# Patient Record
Sex: Male | Born: 2000 | Race: White | Hispanic: No | Marital: Single | Smoking: Never smoker
Health system: Southern US, Community
[De-identification: ages and names within clinical notes are randomized; demographics above are authoritative.]

## PROBLEM LIST (undated history)

## (undated) DIAGNOSIS — L309 Dermatitis, unspecified: Secondary | ICD-10-CM

## (undated) DIAGNOSIS — T7840XA Allergy, unspecified, initial encounter: Secondary | ICD-10-CM

## (undated) DIAGNOSIS — F909 Attention-deficit hyperactivity disorder, unspecified type: Secondary | ICD-10-CM

## (undated) HISTORY — DX: Allergy, unspecified, initial encounter: T78.40XA

## (undated) HISTORY — DX: Dermatitis, unspecified: L30.9

---
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
## 2021-07-09 DIAGNOSIS — J358 Other chronic diseases of tonsils and adenoids: Secondary | ICD-10-CM | POA: Diagnosis not present

## 2021-07-09 DIAGNOSIS — R040 Epistaxis: Secondary | ICD-10-CM | POA: Diagnosis not present

## 2021-07-09 DIAGNOSIS — J3489 Other specified disorders of nose and nasal sinuses: Secondary | ICD-10-CM | POA: Diagnosis not present

## 2021-07-09 DIAGNOSIS — J343 Hypertrophy of nasal turbinates: Secondary | ICD-10-CM | POA: Diagnosis not present

## 2021-10-15 DIAGNOSIS — R251 Tremor, unspecified: Secondary | ICD-10-CM | POA: Diagnosis not present

## 2021-10-15 DIAGNOSIS — M79641 Pain in right hand: Secondary | ICD-10-CM | POA: Diagnosis not present

## 2021-10-15 DIAGNOSIS — M79642 Pain in left hand: Secondary | ICD-10-CM | POA: Diagnosis not present

## 2021-10-15 DIAGNOSIS — M24239 Disorder of ligament, unspecified wrist: Secondary | ICD-10-CM | POA: Diagnosis not present

## 2023-01-28 DIAGNOSIS — Z111 Encounter for screening for respiratory tuberculosis: Secondary | ICD-10-CM | POA: Diagnosis not present

## 2024-08-31 DIAGNOSIS — F331 Major depressive disorder, recurrent, moderate: Secondary | ICD-10-CM | POA: Diagnosis not present

## 2024-08-31 DIAGNOSIS — F909 Attention-deficit hyperactivity disorder, unspecified type: Secondary | ICD-10-CM | POA: Diagnosis not present

## 2024-08-31 DIAGNOSIS — F410 Panic disorder [episodic paroxysmal anxiety] without agoraphobia: Secondary | ICD-10-CM | POA: Diagnosis not present

## 2024-08-31 DIAGNOSIS — F411 Generalized anxiety disorder: Secondary | ICD-10-CM | POA: Diagnosis not present

## 2024-09-29 DIAGNOSIS — F909 Attention-deficit hyperactivity disorder, unspecified type: Secondary | ICD-10-CM | POA: Diagnosis not present

## 2024-09-29 DIAGNOSIS — Z23 Encounter for immunization: Secondary | ICD-10-CM | POA: Diagnosis not present

## 2024-09-29 DIAGNOSIS — F411 Generalized anxiety disorder: Secondary | ICD-10-CM | POA: Diagnosis not present

## 2024-09-29 DIAGNOSIS — F331 Major depressive disorder, recurrent, moderate: Secondary | ICD-10-CM | POA: Diagnosis not present

## 2024-09-29 DIAGNOSIS — F410 Panic disorder [episodic paroxysmal anxiety] without agoraphobia: Secondary | ICD-10-CM | POA: Diagnosis not present

## 2024-10-07 ENCOUNTER — Other Ambulatory Visit: Payer: Self-pay

## 2024-10-07 ENCOUNTER — Emergency Department (HOSPITAL_BASED_OUTPATIENT_CLINIC_OR_DEPARTMENT_OTHER)
Admission: EM | Admit: 2024-10-07 | Discharge: 2024-10-07 | Disposition: A | Discharge status: Home / Self Care | Attending: Emergency Medicine | Admitting: Emergency Medicine

## 2024-10-07 DIAGNOSIS — S6991XA Unspecified injury of right wrist, hand and finger(s), initial encounter: Secondary | ICD-10-CM | POA: Diagnosis not present

## 2024-10-07 DIAGNOSIS — S61210A Laceration without foreign body of right index finger without damage to nail, initial encounter: Secondary | ICD-10-CM | POA: Diagnosis not present

## 2024-10-07 DIAGNOSIS — W268XXA Contact with other sharp object(s), not elsewhere classified, initial encounter: Secondary | ICD-10-CM | POA: Diagnosis not present

## 2024-10-07 NOTE — ED Triage Notes (Signed)
 Pt POV with lac to R index finger after trying to catch falling razor, bleeding controlled PTA.

## 2024-10-07 NOTE — Discharge Instructions (Signed)
 As we discussed, this wound will heal nicely with dermabond. Follow up with your doctor as needed.

## 2024-10-07 NOTE — ED Provider Notes (Signed)
 [place] EMERGENCY DEPARTMENT AT [facility] Provider Note   CSN: [id number] Arrival date & time: 10/07/24  2107     Patient presents with: Laceration   Theodore Simmons is a 23 y.o. male.   Patient to ED with finger laceration after trying to catch a dropped razor. No other injury. Tetanus is UTD.  The history is provided by the patient. No language interpreter was used.  Laceration      Prior to Admission medications   Medication Sig Start Date End Date Taking? Authorizing Provider  loratadine (CLARITIN) 10 MG tablet Take 10 mg by mouth daily.    [provider]    Allergies: Omni-pac    Review of Systems  Updated Vital Signs BP 126/82 (BP Location: Right Arm)   Pulse 88   Temp 98.5 F (36.9 C) (Oral)   Resp 18   Ht 6' 2 (1.88 m)   Wt 83.9 kg   SpO2 99%   BMI 23.75 kg/m   Physical Exam Constitutional:      Appearance: He is well-developed.  Pulmonary:     Effort: Pulmonary effort is normal.  Musculoskeletal:        General: Normal range of motion.     Cervical back: Normal range of motion.  Skin:    General: Skin is warm and dry.     Comments: Small flap laceration to tip of right index finger. No active bleeding. Nail is not involved.   Neurological:     Mental Status: He is alert and oriented to person, place, and time.     (all labs ordered are listed, but only abnormal results are displayed) Labs Reviewed - No data to display  EKG: None  Radiology: No results found.   .Laceration Repair  Date/Time: 10/07/2024 10:24 PM  Performed by: Odell Balls, PA-C Authorized by: Odell Balls, PA-C   Consent:    Consent obtained:  Verbal Universal protocol:    Procedure explained and questions answered to patient or proxy's satisfaction: yes     Patient identity confirmed:  Verbally with patient Anesthesia:    Anesthesia method:  None Laceration details:    Location:  Finger   Finger location:  R index finger   Length  (cm):  0.5 Pre-procedure details:    Preparation:  Patient was prepped and draped in usual sterile fashion Treatment:    Area cleansed with:  Saline   Amount of cleaning:  Standard Skin repair:    Repair method:  Tissue adhesive and Steri-Strips Approximation:    Approximation:  Close Repair type:    Repair type:  Simple Post-procedure details:    Dressing:  Non-adherent dressing   Procedure completion:  Tolerated    Medications Ordered in the ED - No data to display  Clinical Course as of 10/07/24 2256  Thu Oct 07, 2024  2226 Simple shallow flap laceration to finger tip repaired with dermabond and reinforced with steri-strip.  [SU]    Clinical Course User Index [SU] Odell Balls, PA-C                                 Medical Decision Making       Final diagnoses:  Laceration of right index finger without foreign body without damage to nail, initial encounter    ED Discharge Orders     None          Odell Balls, PA-C 10/07/24 2256  Randol Simmonds, MD 10/08/24 [phone]

## 2024-10-07 NOTE — ED Notes (Signed)
 Reviewed discharge instructions and home care with pt. Pt verbalized understanding and had no further questions. Pt exited ED without complications.

## 2024-11-29 DIAGNOSIS — E559 Vitamin D deficiency, unspecified: Secondary | ICD-10-CM | POA: Diagnosis not present

## 2024-11-29 DIAGNOSIS — Z0000 Encounter for general adult medical examination without abnormal findings: Secondary | ICD-10-CM | POA: Diagnosis not present

## 2024-11-29 DIAGNOSIS — Z13220 Encounter for screening for lipoid disorders: Secondary | ICD-10-CM | POA: Diagnosis not present
